# Patient Record
Sex: Female | Born: 2005 | Race: White | Hispanic: No | Marital: Single | Smoking: Never smoker
Health system: Southern US, Community
[De-identification: ages and names within clinical notes are randomized; demographics above are authoritative.]

## PROBLEM LIST (undated history)

## (undated) DIAGNOSIS — H10409 Unspecified chronic conjunctivitis, unspecified eye: Secondary | ICD-10-CM

## (undated) HISTORY — PX: NO PAST SURGERIES: SHX2092

---
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
## 2024-08-12 ENCOUNTER — Ambulatory Visit: Payer: Self-pay
